# Patient Record
Sex: Male | Born: 1950 | Race: White | Hispanic: No | Marital: Single | Smoking: Former smoker
Health system: Southern US, Community
[De-identification: ages and names within clinical notes are randomized; demographics above are authoritative.]

---

## 1998-09-17 ENCOUNTER — Encounter: Admission: RE | Admit: 1998-09-17 | Discharge: 1998-10-15 | Payer: Self-pay | Admitting: Anesthesiology

## 2000-04-13 ENCOUNTER — Inpatient Hospital Stay (HOSPITAL_COMMUNITY): Admission: EM | Admit: 2000-04-13 | Discharge: 2000-04-21 | Payer: Self-pay | Admitting: Emergency Medicine

## 2000-04-13 ENCOUNTER — Encounter: Payer: Self-pay | Admitting: Emergency Medicine

## 2000-04-13 ENCOUNTER — Encounter: Payer: Self-pay | Admitting: General Surgery

## 2000-04-16 ENCOUNTER — Encounter: Payer: Self-pay | Admitting: General Surgery

## 2000-04-17 ENCOUNTER — Encounter: Payer: Self-pay | Admitting: General Surgery

## 2005-11-09 ENCOUNTER — Emergency Department (HOSPITAL_COMMUNITY): Admission: EM | Admit: 2005-11-09 | Discharge: 2005-11-10 | Payer: Self-pay | Admitting: Emergency Medicine

## 2019-04-05 ENCOUNTER — Other Ambulatory Visit: Payer: Self-pay

## 2019-04-05 DIAGNOSIS — Z20822 Contact with and (suspected) exposure to covid-19: Secondary | ICD-10-CM

## 2019-04-06 LAB — NOVEL CORONAVIRUS, NAA: SARS-CoV-2, NAA: NOT DETECTED

## 2019-04-07 ENCOUNTER — Telehealth: Payer: Self-pay

## 2019-04-07 NOTE — Telephone Encounter (Signed)
Pt. Called, given COVID 19 results.

## 2019-12-09 ENCOUNTER — Emergency Department (HOSPITAL_COMMUNITY): Payer: Medicare Other

## 2019-12-09 ENCOUNTER — Encounter (HOSPITAL_COMMUNITY): Payer: Self-pay | Admitting: Emergency Medicine

## 2019-12-09 ENCOUNTER — Emergency Department (HOSPITAL_COMMUNITY)
Admission: EM | Admit: 2019-12-09 | Discharge: 2019-12-09 | Disposition: A | Discharge status: Home / Self Care | Payer: Medicare Other | Attending: Emergency Medicine | Admitting: Emergency Medicine

## 2019-12-09 ENCOUNTER — Other Ambulatory Visit: Payer: Self-pay

## 2019-12-09 DIAGNOSIS — Z87891 Personal history of nicotine dependence: Secondary | ICD-10-CM | POA: Insufficient documentation

## 2019-12-09 DIAGNOSIS — R630 Anorexia: Secondary | ICD-10-CM | POA: Diagnosis present

## 2019-12-09 DIAGNOSIS — R531 Weakness: Secondary | ICD-10-CM

## 2019-12-09 DIAGNOSIS — U071 COVID-19: Secondary | ICD-10-CM | POA: Insufficient documentation

## 2019-12-09 LAB — COMPREHENSIVE METABOLIC PANEL
ALT: 59 U/L — ABNORMAL HIGH (ref 0–44)
AST: 57 U/L — ABNORMAL HIGH (ref 15–41)
Albumin: 4 g/dL (ref 3.5–5.0)
Alkaline Phosphatase: 75 U/L (ref 38–126)
Anion gap: 10 (ref 5–15)
BUN: 15 mg/dL (ref 8–23)
CO2: 27 mmol/L (ref 22–32)
Calcium: 9.5 mg/dL (ref 8.9–10.3)
Chloride: 98 mmol/L (ref 98–111)
Creatinine, Ser: 1 mg/dL (ref 0.61–1.24)
GFR calc Af Amer: >60 mL/min (ref >60)
GFR calc non Af Amer: >60 mL/min (ref >60)
Glucose, Bld: 115 mg/dL — ABNORMAL HIGH (ref 70–99)
Potassium: 3.5 mmol/L (ref 3.5–5.1)
Sodium: 135 mmol/L (ref 135–145)
Total Bilirubin: 0.6 mg/dL (ref 0.3–1.2)
Total Protein: 7.9 g/dL (ref 6.5–8.1)

## 2019-12-09 LAB — CBC WITH DIFFERENTIAL/PLATELET
Abs Immature Granulocytes: 0.02 10*3/uL (ref 0.00–0.07)
Basophils Absolute: 0 10*3/uL (ref 0.0–0.1)
Basophils Relative: 0 %
Eosinophils Absolute: 0 10*3/uL (ref 0.0–0.5)
Eosinophils Relative: 0 %
HCT: 41.9 % (ref 39.0–52.0)
Hemoglobin: 14.2 g/dL (ref 13.0–17.0)
Immature Granulocytes: 1 %
Lymphocytes Relative: 33 %
Lymphs Abs: 1.4 10*3/uL (ref 0.7–4.0)
MCH: 29.7 pg (ref 26.0–34.0)
MCHC: 33.9 g/dL (ref 30.0–36.0)
MCV: 87.7 fL (ref 80.0–100.0)
Monocytes Absolute: 0.4 10*3/uL (ref 0.1–1.0)
Monocytes Relative: 10 %
Neutro Abs: 2.4 10*3/uL (ref 1.7–7.7)
Neutrophils Relative %: 56 %
Platelets: 168 10*3/uL (ref 150–400)
RBC: 4.78 MIL/uL (ref 4.22–5.81)
RDW: 12.6 % (ref 11.5–15.5)
WBC: 4.2 10*3/uL (ref 4.0–10.5)
nRBC: 0 % (ref 0.0–0.2)

## 2019-12-09 MED ORDER — ONDANSETRON 4 MG PO TBDP: 4.0000 mg | ORAL_TABLET | Freq: Three times a day (TID) | ORAL | 0 refills | Status: AC | PRN

## 2019-12-09 MED ORDER — ALBUTEROL SULFATE HFA 108 (90 BASE) MCG/ACT IN AERS
2.0000 | INHALATION_SPRAY | Freq: Once | RESPIRATORY_TRACT | Status: AC
  Administered 2019-12-09: 20:00:00 2 via RESPIRATORY_TRACT
  Filled 2019-12-09: qty 6.7

## 2019-12-09 MED ORDER — AEROCHAMBER PLUS FLO-VU MEDIUM MISC
1.0000 | Freq: Once | Status: AC
  Administered 2019-12-09: 1
  Filled 2019-12-09: qty 1

## 2019-12-09 MED ORDER — BENZONATATE 100 MG PO CAPS: 100.0000 mg | ORAL_CAPSULE | Freq: Three times a day (TID) | ORAL | 0 refills | Status: AC

## 2019-12-09 MED ORDER — SODIUM CHLORIDE 0.9 % IV BOLUS
500.0000 mL | Freq: Once | INTRAVENOUS | Status: AC
  Administered 2019-12-09: 20:00:00 500 mL via INTRAVENOUS

## 2019-12-09 MED ORDER — ONDANSETRON HCL 4 MG/2ML IJ SOLN
4.0000 mg | Freq: Once | INTRAMUSCULAR | Status: AC
  Administered 2019-12-09: 4 mg via INTRAVENOUS
  Filled 2019-12-09: qty 2

## 2019-12-09 NOTE — Discharge Instructions (Signed)
You were seen in the emergency department today for generalized weakness, poor appetite, and several other symptoms since your diagnosis of COVID-19.  Your work-up was overall reassuring.  Your liver function tests were mildly elevated which frequently occurs with COVID-19, have these rechecked by your primary care provider within 1 week.  Your other labs were all fairly normal.  We are sending you home with the following medicines: -Albuterol inhaler: Use 1 to 2 puffs every 4-6 hours as needed for trouble breathing or wheezing. -Tessalon: Take every 8 hours as needed for cough -Zofran: Take every hours as needed for nausea and vomiting.  We have prescribed you new medication(s) today. Discuss the medications prescribed today with your pharmacist as they can have adverse effects and interactions with your other medicines including over the counter and prescribed medications. Seek medical evaluation if you start to experience new or abnormal symptoms after taking one of these medicines, seek care immediately if you start to experience difficulty breathing, feeling of your throat closing, facial swelling, or rash as these could be indications of a more serious allergic reaction  Please continue to follow isolation instructions.  Please be sure to stay well-hydrated and drink plenty of fluids with electrolytes present.  Please follow-up with your primary care provider within 3 to 5 days, return to the ER for new or worsening symptoms including but not limited to increased trouble breathing, inability to keep fluids down, increased pain, passing out, or any other concerns.

## 2019-12-09 NOTE — ED Provider Notes (Signed)
[facility]-EMERGENCY DEPT Provider Note   CSN: [id number] Arrival date & time: 12/09/19  1808     History Chief Complaint  Patient presents with  . Emesis  . Nausea  . Diarrhea  . Fever    Randall Gordon is a 69 y.o. male with a history of nephrectomy & cholecystectomy who presents to the ED with complaints of poor appetite with generalized weakness, tested positive for COVID 19 04/02, has felt poorly since 04/01.  Patient states he has felt poorly with fevers, chills, generalized body aches, nasal congestion, dry cough, nausea, few episodes of emesis, and a few episodes of loose stools.  He states he is not necessarily short of breath but that his breathing seems shallow at times.  No specific alleviating or aggravating factors to his symptoms.  He states he had poor appetite with poor p.o. intake and is concerned he is dehydrated as he is generally weak.  He has had applesauce and some fluids today.  Denies lightheadedness, dizziness, syncope, hematemesis, melena, abdominal pain, hematochezia, or chest pain.  HPI     History reviewed. No pertinent past medical history.  There are no problems to display for this patient.   Past Surgical History:  Procedure Laterality Date  . GASTRECTOMY    . NEPHRECTOMY         History reviewed. No pertinent family history.  Social History   Tobacco Use  . Smoking status: Former Games developer  . Smokeless tobacco: Never Used  Substance Use Topics  . Alcohol use: Not on file  . Drug use: Not on file    Home Medications Prior to Admission medications   Not on File    Allergies    Patient has no known allergies.  Review of Systems   Review of Systems  Constitutional: Positive for chills, fatigue and fever.  HENT: Positive for congestion.   Respiratory: Positive for cough and shortness of breath (shallow).   Cardiovascular: Negative for chest pain and leg swelling.  Gastrointestinal: Positive for diarrhea,  nausea and vomiting. Negative for abdominal pain, blood in stool and constipation.  Genitourinary: Negative for dysuria.  Musculoskeletal: Positive for myalgias.  Neurological: Positive for weakness. Negative for dizziness, syncope and light-headedness.  All other systems reviewed and are negative.   Physical Exam Updated Vital Signs BP (!) 112/100   Pulse 83   Temp 99 F (37.2 C) (Oral)   Resp 11   SpO2 97%   Physical Exam Vitals and nursing note reviewed.  Constitutional:      General: He is not in acute distress.    Appearance: He is well-developed. He is not toxic-appearing.  HENT:     Head: Normocephalic and atraumatic.     Right Ear: Ear canal normal. Tympanic membrane is not perforated, erythematous, retracted or bulging.     Left Ear: Ear canal normal. Tympanic membrane is not perforated, erythematous, retracted or bulging.     Ears:     Comments: No mastoid erythema/swellng/tenderness.     Nose:     Right Sinus: No maxillary sinus tenderness or frontal sinus tenderness.     Left Sinus: No maxillary sinus tenderness or frontal sinus tenderness.     Mouth/Throat:     Mouth: Mucous membranes are dry.     Pharynx: Oropharynx is clear. Uvula midline. No oropharyngeal exudate or posterior oropharyngeal erythema.     Comments: Posterior oropharynx is symmetric appearing. Patient tolerating own secretions without difficulty. No trismus. No drooling. No hot  potato voice. No swelling beneath the tongue, submandibular compartment is soft.  Eyes:     General:        Right eye: No discharge.        Left eye: No discharge.     Conjunctiva/sclera: Conjunctivae normal.  Cardiovascular:     Rate and Rhythm: Normal rate and regular rhythm.  Pulmonary:     Effort: Pulmonary effort is normal. No respiratory distress.     Breath sounds: Normal breath sounds. No wheezing, rhonchi or rales.  Abdominal:     General: There is no distension.     Palpations: Abdomen is soft.      Tenderness: There is no abdominal tenderness. There is no right CVA tenderness, left CVA tenderness, guarding or rebound.  Musculoskeletal:     Cervical back: Neck supple. No rigidity.  Lymphadenopathy:     Cervical: No cervical adenopathy.  Skin:    General: Skin is warm and dry.     Findings: No rash.  Neurological:     Mental Status: He is alert.  Psychiatric:        Behavior: Behavior normal.     ED Results / Procedures / Treatments   Labs (all labs ordered are listed, but only abnormal results are displayed) Labs Reviewed  COMPREHENSIVE METABOLIC PANEL - Abnormal; Notable for the following components:      Result Value   Glucose, Bld 115 (*)    AST 57 (*)    ALT 59 (*)    All other components within normal limits  CBC WITH DIFFERENTIAL/PLATELET    EKG None  Radiology DG Chest Portable 1 View  Result Date: 12/09/2019 CLINICAL DATA:  Nausea, vomiting and diarrhea. EXAM: PORTABLE CHEST 1 VIEW COMPARISON:  None. FINDINGS: Very mild atelectasis and/or infiltrate seen within the left lung base. There is no evidence of pleural effusion or pneumothorax. The heart size and mediastinal contours are within normal limits. The visualized skeletal structures are unremarkable. IMPRESSION: Very mild left basilar atelectasis and/or infiltrate. Electronically Signed   By: Aram Candela M.D.   On: 12/09/2019 20:02    Procedures Procedures (including critical care time)  Medications Ordered in ED Medications - No data to display  ED Course  I have reviewed the triage vital signs and the nursing notes.  Pertinent labs & imaging results that were available during my care of the patient were reviewed by me and considered in my medical decision making (see chart for details).    Randall Gordon was evaluated in Emergency Department on 12/09/2019 for the symptoms described in the history of present illness. He/she was evaluated in the context of the global COVID-19 pandemic, which  necessitated consideration that the patient might be at risk for infection with the SARS-CoV-2 virus that causes COVID-19. Institutional protocols and algorithms that pertain to the evaluation of patients at risk for COVID-19 are in a state of rapid change based on information released by regulatory bodies including the CDC and federal and state organizations. These policies and algorithms were followed during the patient's care in the ED.  MDM Rules/Calculators/A&P                      Patient known positive for COVID-19 presents to the emergency department for evaluation of generalized weakness and poor appetite.  He is nontoxic, resting comfortably, vitals without significant abnormality.  He has a benign physical exam other than some mildly dry mucous membranes..  Lungs are clear.  No  increased work of breathing.  Abdomen nontender without peritoneal signs.  Plan for labs, chest x-ray, and symptomatic treatment.  Will give 500 cc fluid bolus given dry mucous membranes, avoid aggressive hydration and Covid patient. Labs ordered, reviewed, and interpreted: CBC: No leukocytosis, anemia, or platelet dysfunction. CMP: Mild transaminitis likely secondary to COVID-19.  Renal function preserved.  No significant electrolyte derangement. Chest x-ray:.  Mild basilar atelectasis and/or infiltrate, likely related to his COVID-19 infection.  Personally reviewed and interpreted imaging, in agreement with radiologist read.  Patient feeling better status post interventions in the emergency department.  He is tolerating p.o.  I personally ambulated the patient throughout exam room on reassessment, he is able to maintain SPO2 greater than 95% with ambulation.  On repeat abdominal exam remains nontender without peritoneal signs, do not suspect acute surgical process.  No respiratory failure, electrolyte derangement, renal failure to necessitate admission.  Patient overall appears appropriate for discharge home with  symptomatic management. I discussed results, treatment plan, need for follow-up, and return precautions with the patient. Provided opportunity for questions, patient confirmed understanding and is in agreement with plan.   This is a shared visit with supervising physician Dr. Eulis Foster who has independently evaluated patient & provided guidance in evaluation/management/disposition, in agreement with care    Final Clinical Impression(s) / ED Diagnoses Final diagnoses:  COVID-19  Generalized weakness    Rx / DC Orders ED Discharge Orders         Ordered    ondansetron (ZOFRAN ODT) 4 MG disintegrating tablet  Every 8 hours PRN     12/09/19 2207    benzonatate (TESSALON) 100 MG capsule  Every 8 hours     12/09/19 2207           Marshon Bangs, Glynda Jaeger, PA-C 12/09/19 2209    Daleen Bo, MD 12/10/19 1320

## 2019-12-09 NOTE — ED Triage Notes (Signed)
Patient presents with nausea, vomiting and diarrhea since 12/01/19 when he was diagnosed with Covid 19. He has been unable to eat and has had fevers. The patient took Tylenol at 4pm.   EMS vitals: 130/92 BP 90 HR 96% O2 sat on room air 123 CBG 97.8 Temp

## 2021-05-22 IMAGING — DX DG CHEST 1V PORT
1 series · 1 of 1 positions shown · non-contrast
Comparison: None.

CLINICAL DATA: Nausea, vomiting and diarrhea.

EXAM:
PORTABLE CHEST 1 VIEW

[chest ap]
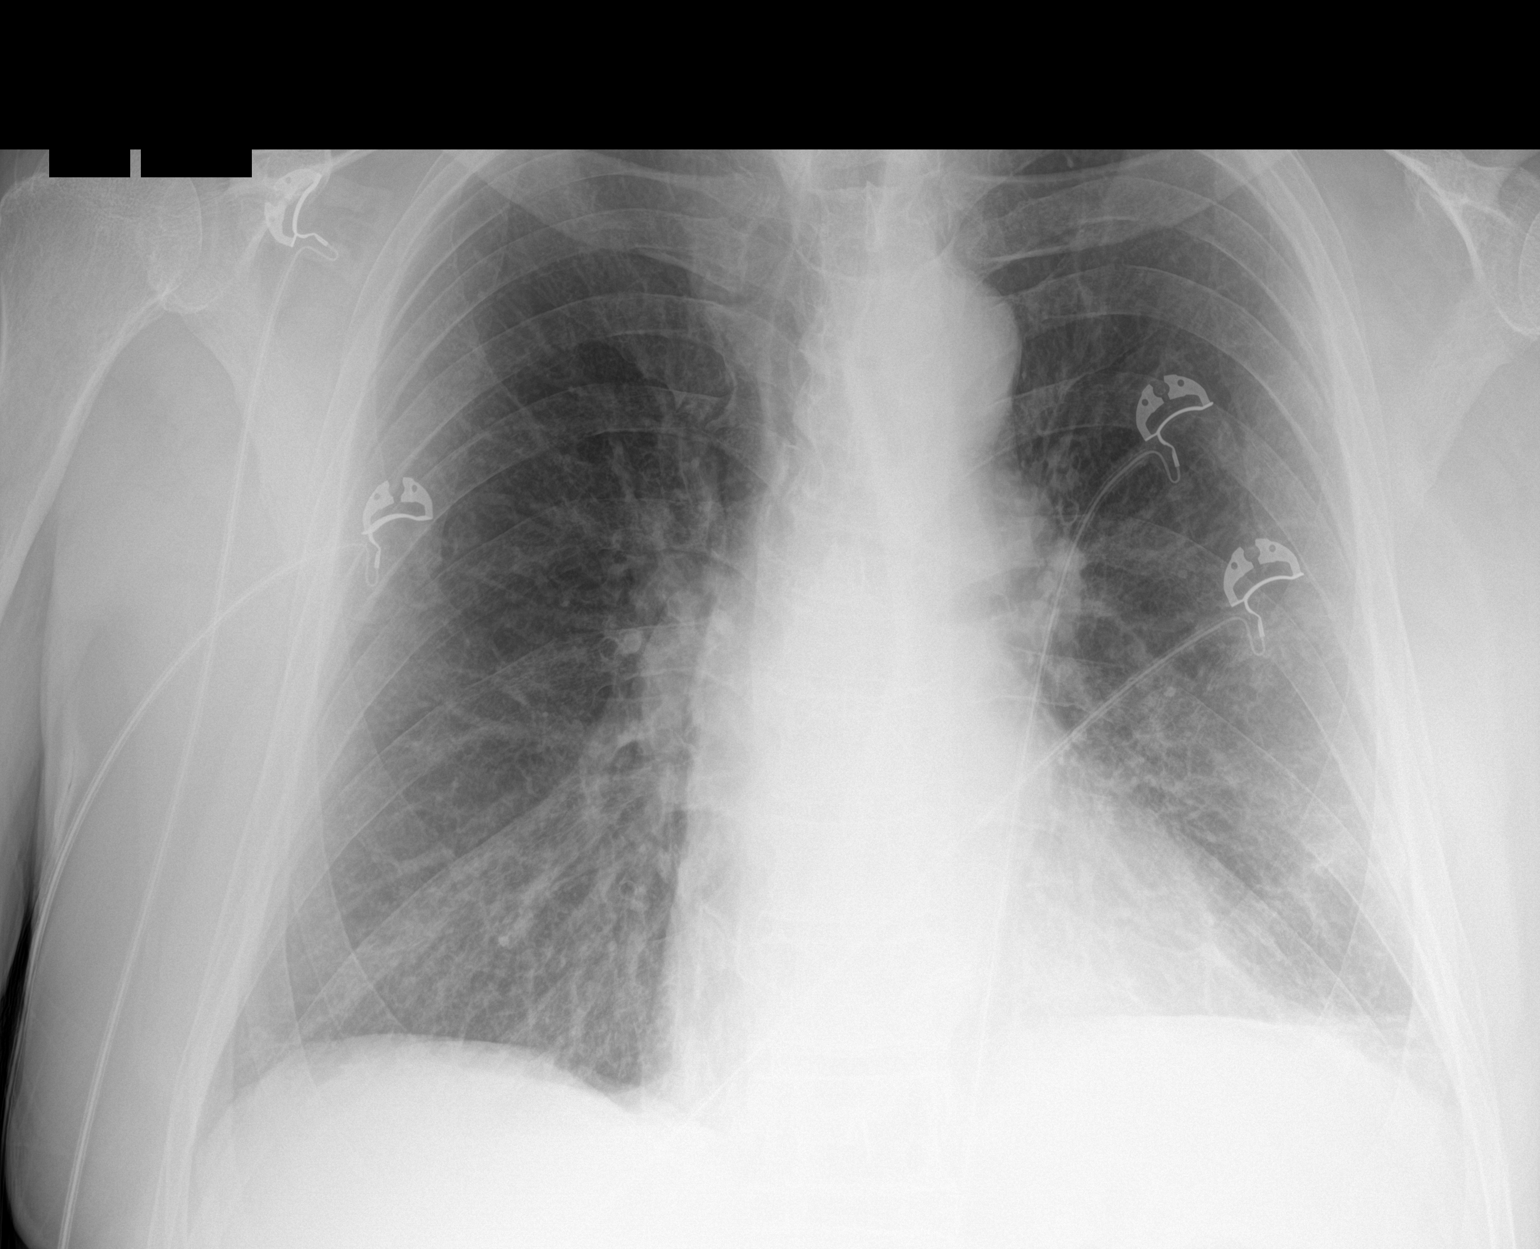

[1 of 1 positions shown; findings below may reference images not displayed]

FINDINGS: Very mild atelectasis and/or infiltrate seen within the left lung
base.

There is no evidence of pleural effusion or pneumothorax.

The heart size and mediastinal contours are within normal limits.

The visualized skeletal structures are unremarkable.
IMPRESSION: Very mild left basilar atelectasis and/or infiltrate.
